# Patient Record
Sex: Female | Born: 1995 | Race: White | Hispanic: No | Marital: Single
Health system: Southern US, Community
[De-identification: ages and names within clinical notes are randomized; demographics above are authoritative.]

---

## 2018-09-24 ENCOUNTER — Encounter (HOSPITAL_COMMUNITY): Payer: Self-pay | Admitting: Student

## 2018-09-24 ENCOUNTER — Emergency Department (HOSPITAL_COMMUNITY)
Admission: EM | Admit: 2018-09-24 | Discharge: 2018-09-24 | Disposition: A | Discharge status: Home / Self Care | Payer: Medicaid Other | Attending: Emergency Medicine | Admitting: Emergency Medicine

## 2018-09-24 ENCOUNTER — Emergency Department (HOSPITAL_COMMUNITY): Payer: Medicaid Other

## 2018-09-24 DIAGNOSIS — R0789 Other chest pain: Secondary | ICD-10-CM | POA: Insufficient documentation

## 2018-09-24 DIAGNOSIS — M79631 Pain in right forearm: Secondary | ICD-10-CM | POA: Diagnosis not present

## 2018-09-24 DIAGNOSIS — Z041 Encounter for examination and observation following transport accident: Secondary | ICD-10-CM | POA: Diagnosis present

## 2018-09-24 MED ORDER — METHOCARBAMOL 500 MG PO TABS: 500.0000 mg | ORAL_TABLET | Freq: Three times a day (TID) | ORAL | 0 refills | Status: AC | PRN

## 2018-09-24 MED ORDER — NAPROXEN 500 MG PO TABS: 500.0000 mg | ORAL_TABLET | Freq: Two times a day (BID) | ORAL | 0 refills | Status: AC

## 2018-09-24 NOTE — ED Notes (Signed)
Patient verbalizes understanding of discharge instructions. Opportunity for questioning and answers were provided. Armband removed by staff, pt discharged from ED.  
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
